# Patient Record
Sex: Female | Born: 1935 | Race: White | Hispanic: No | Marital: Married | Smoking: Never smoker
Health system: Southern US, Community
[De-identification: ages and names within clinical notes are randomized; demographics above are authoritative.]

## PROBLEM LIST (undated history)

## (undated) DIAGNOSIS — K219 Gastro-esophageal reflux disease without esophagitis: Secondary | ICD-10-CM

## (undated) DIAGNOSIS — Z860100 Personal history of colon polyps, unspecified: Secondary | ICD-10-CM

## (undated) DIAGNOSIS — F329 Major depressive disorder, single episode, unspecified: Secondary | ICD-10-CM

## (undated) DIAGNOSIS — F419 Anxiety disorder, unspecified: Secondary | ICD-10-CM

## (undated) DIAGNOSIS — K589 Irritable bowel syndrome without diarrhea: Secondary | ICD-10-CM

## (undated) DIAGNOSIS — N3010 Interstitial cystitis (chronic) without hematuria: Secondary | ICD-10-CM

## (undated) DIAGNOSIS — E785 Hyperlipidemia, unspecified: Secondary | ICD-10-CM

## (undated) DIAGNOSIS — M810 Age-related osteoporosis without current pathological fracture: Secondary | ICD-10-CM

## (undated) DIAGNOSIS — F32A Depression, unspecified: Secondary | ICD-10-CM

## (undated) DIAGNOSIS — G4730 Sleep apnea, unspecified: Secondary | ICD-10-CM

## (undated) HISTORY — DX: Major depressive disorder, single episode, unspecified: F32.9

## (undated) HISTORY — PX: TUBAL LIGATION: SHX77

## (undated) HISTORY — DX: Depression, unspecified: F32.A

## (undated) HISTORY — DX: Hyperlipidemia, unspecified: E78.5

## (undated) HISTORY — DX: Gastro-esophageal reflux disease without esophagitis: K21.9

## (undated) HISTORY — DX: Interstitial cystitis (chronic) without hematuria: N30.10

## (undated) HISTORY — DX: Age-related osteoporosis without current pathological fracture: M81.0

## (undated) HISTORY — DX: Irritable bowel syndrome, unspecified: K58.9

## (undated) HISTORY — DX: Anxiety disorder, unspecified: F41.9

## (undated) HISTORY — PX: ABLATION: SHX5711

## (undated) HISTORY — DX: Personal history of colon polyps, unspecified: Z86.0100

## (undated) HISTORY — DX: Personal history of colonic polyps: Z86.010

## (undated) HISTORY — DX: Sleep apnea, unspecified: G47.30

---

## 2000-06-10 ENCOUNTER — Ambulatory Visit (HOSPITAL_COMMUNITY): Admission: RE | Admit: 2000-06-10 | Discharge: 2000-06-11 | Payer: Self-pay | Admitting: Internal Medicine

## 2000-06-10 ENCOUNTER — Encounter: Payer: Self-pay | Admitting: Internal Medicine

## 2001-12-19 ENCOUNTER — Encounter: Payer: Self-pay | Admitting: Urology

## 2001-12-19 ENCOUNTER — Encounter: Admission: RE | Admit: 2001-12-19 | Discharge: 2001-12-19 | Payer: Self-pay | Admitting: Urology

## 2006-05-31 ENCOUNTER — Ambulatory Visit: Payer: Self-pay | Admitting: Internal Medicine

## 2006-06-11 ENCOUNTER — Inpatient Hospital Stay (HOSPITAL_COMMUNITY): Admission: EM | Admit: 2006-06-11 | Discharge: 2006-06-12 | Payer: Self-pay | Admitting: Emergency Medicine

## 2006-06-11 ENCOUNTER — Ambulatory Visit: Payer: Self-pay | Admitting: Internal Medicine

## 2006-06-21 ENCOUNTER — Ambulatory Visit: Payer: Self-pay

## 2006-06-30 ENCOUNTER — Ambulatory Visit: Payer: Self-pay | Admitting: Internal Medicine

## 2006-07-26 HISTORY — PX: OTHER SURGICAL HISTORY: SHX169

## 2007-08-22 ENCOUNTER — Ambulatory Visit: Payer: Self-pay | Admitting: Ophthalmology

## 2007-09-08 ENCOUNTER — Ambulatory Visit: Payer: Self-pay | Admitting: Ophthalmology

## 2008-09-29 IMAGING — CT CT ANGIO CHEST
2 of 5 series · 19 of 36 positions shown · IV contrast (120 ML OMNI 300)
Comparison: none

CLINICAL DATA: Midsternal chest pain radiating to her back, shortness of breath and heaviness.  Evaluate for acute pulmonary embolus.  
 CT ANGIOGRAPHY OF CHEST:
TECHNIQUE: Multidetector CT imaging of the chest was performed during bolus injection of intravenous contrast.  Multiplanar CT angiographic image reconstructions were generated to evaluate the vascular anatomy.
 Contrast:  100 cc Omnipaque.

[Series 3: pe · axial · 0.70mm/px · z∈[-273,-10]mm · 16 of 240 slices shown]
[im 15/240  lung]
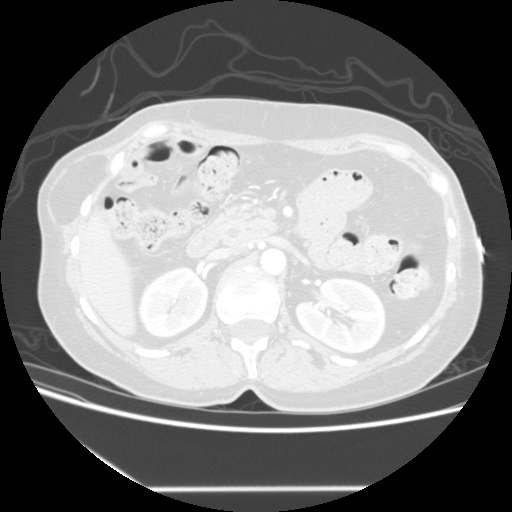
[im 29/240  mediastinal]
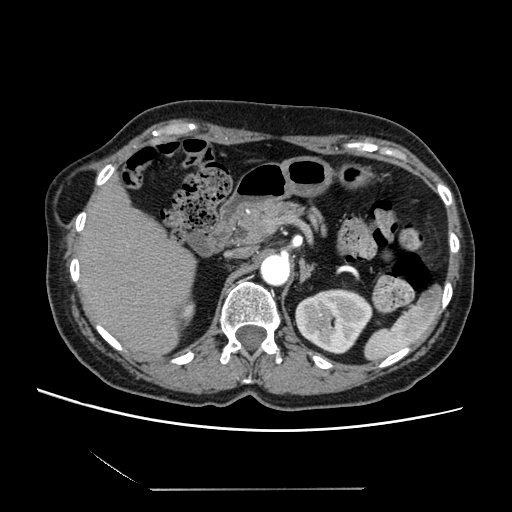
[im 43/240  lung]
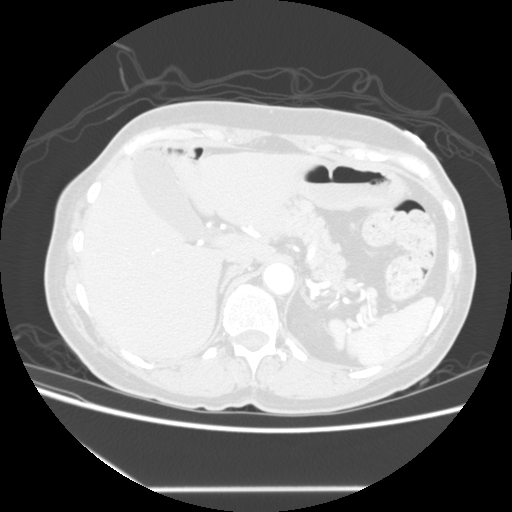
[im 57/240  mediastinal]
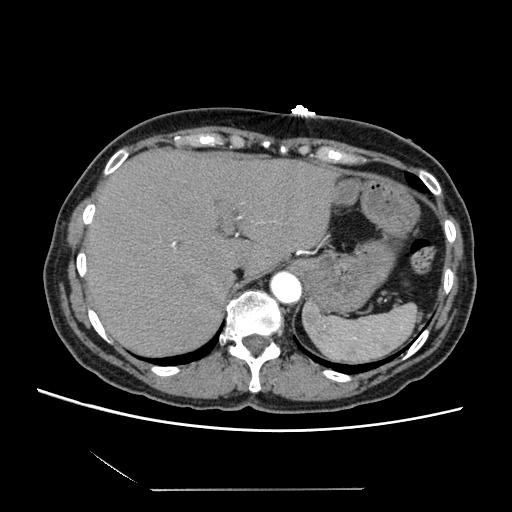
[im 71/240  lung]
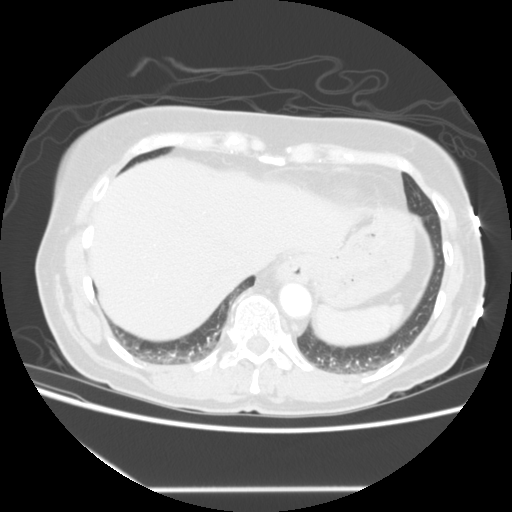
[im 85/240  mediastinal]
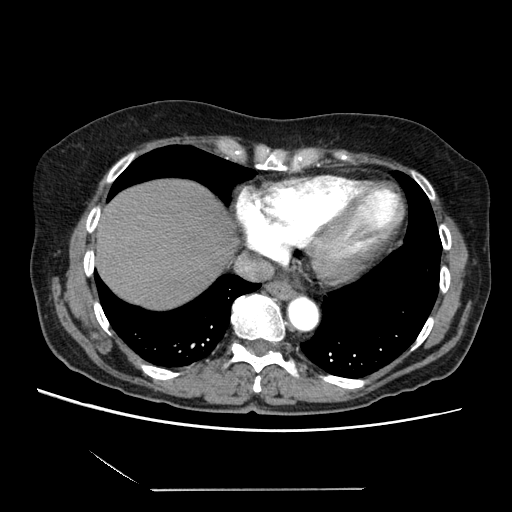
[im 99/240  lung]
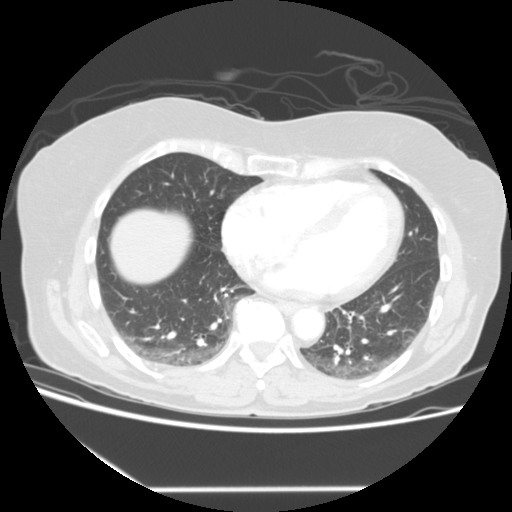
[im 113/240  mediastinal]
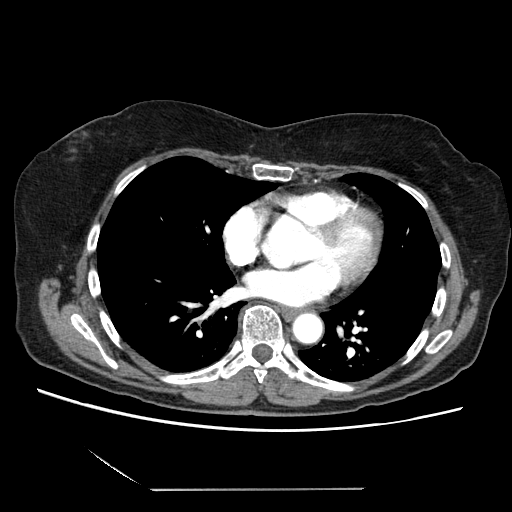
[im 127/240  lung]
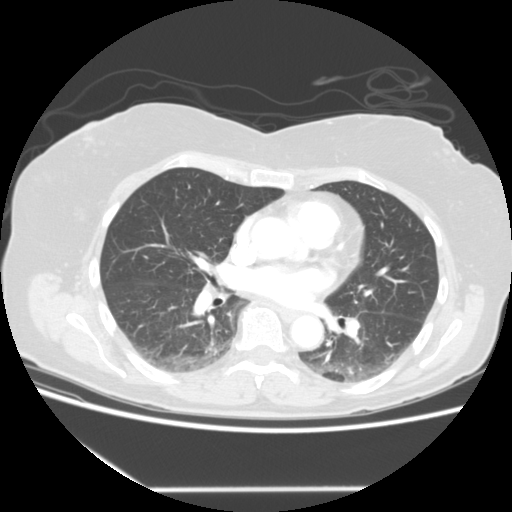
[im 141/240  mediastinal]
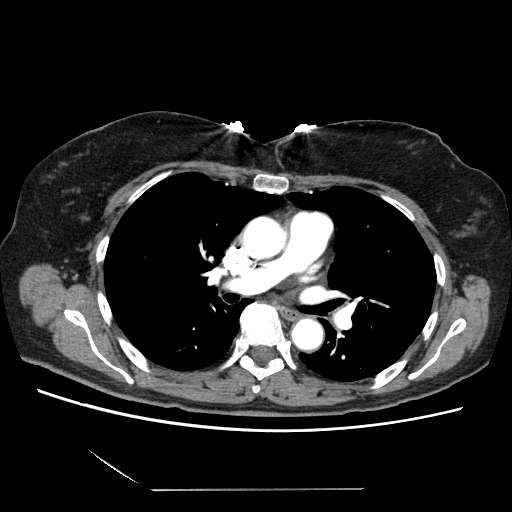
[im 155/240  lung]
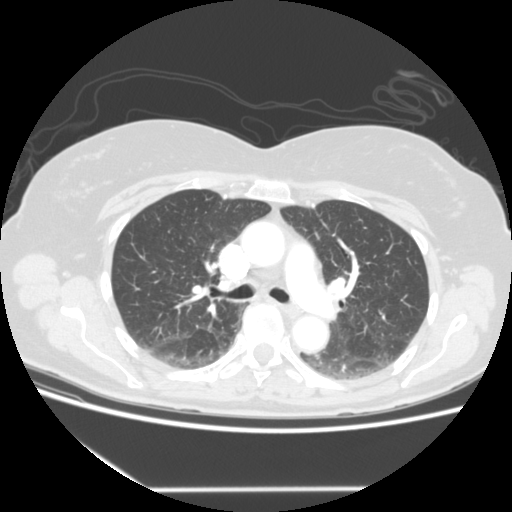
[im 169/240  mediastinal]
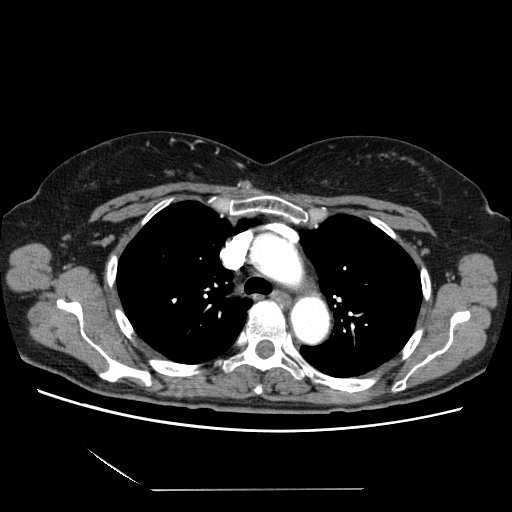
[im 183/240  lung]
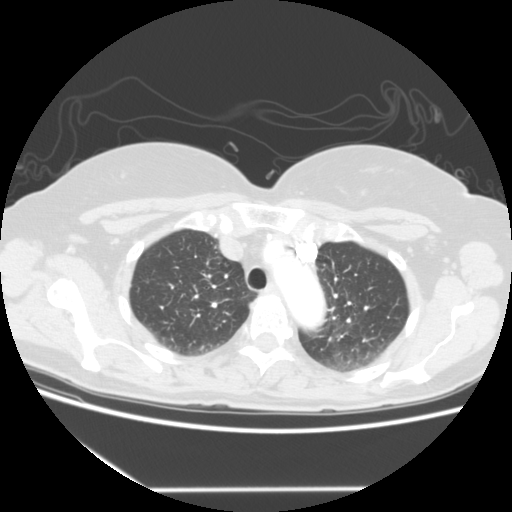
[im 197/240  mediastinal]
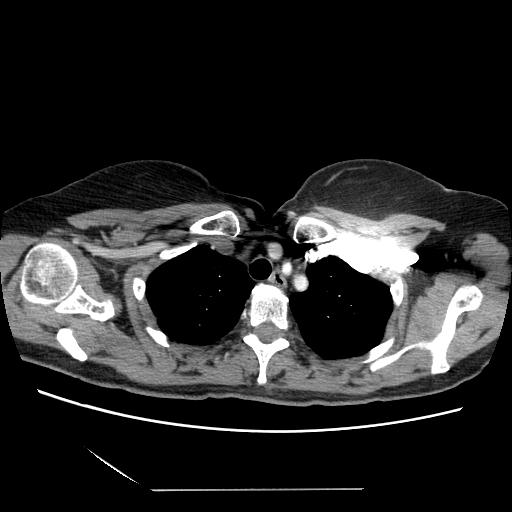
[im 211/240  lung]
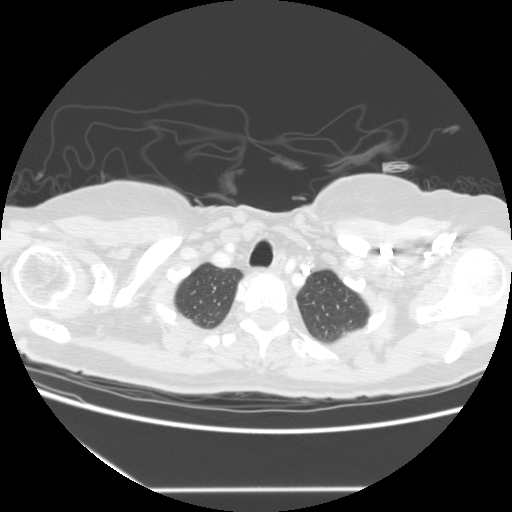
[im 225/240  mediastinal]
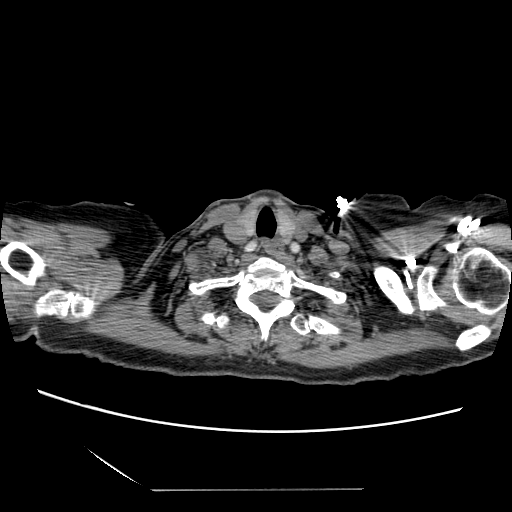

[Series 301: reformatted · coronal · 0.70mm/px · 3 of 102 slices shown]
[im 21/102  mediastinal]
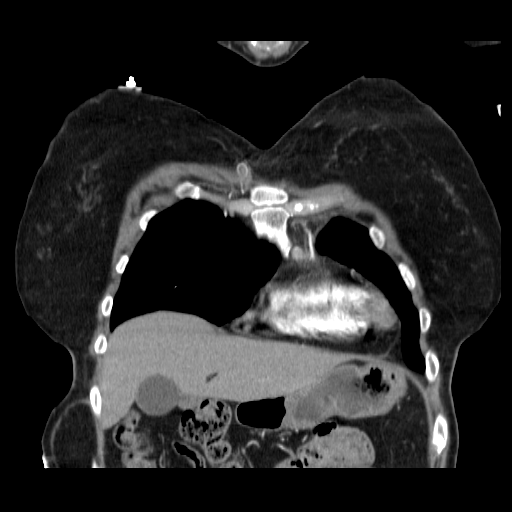
[im 41/102  mediastinal]
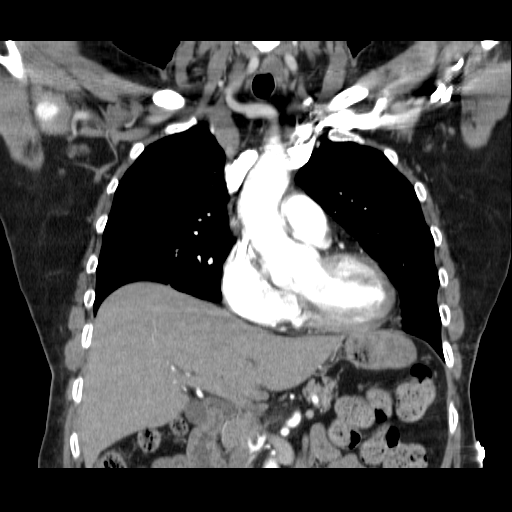
[im 61/102  mediastinal]
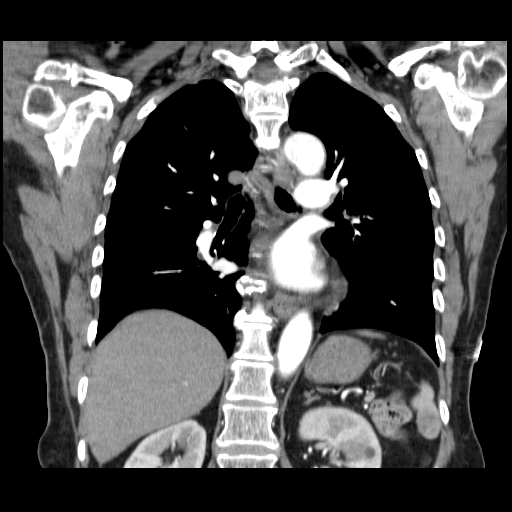

[19 of 36 positions shown; findings below may reference images not displayed]

FINDINGS: There are no abnormal filling defects within the main pulmonary artery or its branches to suggest acute pulmonary embolus.  The ascending and descending thoracic aorta are normal in appearance.
 There is a small hiatal hernia present.  
 No pleural or pericardial effusions are noted. 
 No enlarged axillary lymph nodes.  
 There is no mediastinal or hilar lymphadenopathy.  
 Dependent changes and atelectasis seen at both lung bases posteriorly.  
 No focal pulmonary nodules or masses are noted.  
 There is a fat density mass within the right anterior abdominal wall, which is only partially visualized.  This measures 5.9 X 2.0 cm and likely represents a lipoma.  
 There are two low-density lesions within the left kidney, which likely represent simple cysts.
 Common bile duct is increased in caliber measuring 8.4 mm.  No intrahepatic biliary ductal dilatation is noted.  
 Review of the bone windows shows multilevel thoracic spondylosis.
IMPRESSION: 1.  No acute pulmonary embolus.  
 2.  Dependent changes at both lung bases posteriorly including atelectasis.
 3.  Increased caliber of common bile duct without evidence for intrahepatic biliary ductal dilatation.  
 4.  Fat density mass within right anterior abdominal wall.

## 2012-08-18 ENCOUNTER — Other Ambulatory Visit: Payer: Self-pay | Admitting: *Deleted

## 2012-08-18 DIAGNOSIS — R002 Palpitations: Secondary | ICD-10-CM

## 2016-09-29 ENCOUNTER — Encounter: Payer: Self-pay | Admitting: Internal Medicine

## 2016-12-28 HISTORY — PX: COLONOSCOPY: SHX174

## 2017-09-21 ENCOUNTER — Encounter: Payer: Self-pay | Admitting: Neurology

## 2017-09-21 ENCOUNTER — Ambulatory Visit: Payer: Medicare Other | Admitting: Neurology

## 2017-09-21 VITALS — BP 152/82 | HR 64 | Ht 64.0 in | Wt 134.0 lb

## 2017-09-21 DIAGNOSIS — R413 Other amnesia: Secondary | ICD-10-CM | POA: Diagnosis not present

## 2017-09-21 DIAGNOSIS — R443 Hallucinations, unspecified: Secondary | ICD-10-CM

## 2017-09-21 DIAGNOSIS — F32A Depression, unspecified: Secondary | ICD-10-CM | POA: Insufficient documentation

## 2017-09-21 DIAGNOSIS — F329 Major depressive disorder, single episode, unspecified: Secondary | ICD-10-CM | POA: Diagnosis not present

## 2017-09-21 DIAGNOSIS — F0390 Unspecified dementia without behavioral disturbance: Secondary | ICD-10-CM | POA: Diagnosis not present

## 2017-09-21 NOTE — Progress Notes (Signed)
[facility]  PATIENT: Chelsea Floyd DOB: [birth year]  REFERRING DOCTOR OR PCP:  Joetta Manners, MD SOURCE: Patient, daughter, notes from Dr. Carolyne Fiscal, patient, daughter, notes from Dr. Carolyne Fiscal, laboratory reports  _________________________________   HISTORICAL  CHIEF COMPLAINT:  Chief Complaint  Patient presents with  . New Patient (Initial Visit)    HISTORY OF PRESENT ILLNESS:  I had the pleasure of seeing your patient, Chelsea Floyd, and [facility] for neurologic consultation regarding her memory loss.  She is an 84 year old woman who has had slow;y progressive memory issues and verbal fluency issues over the past few years.    She has had issues with losing items in hr house.   She has some trouble coming up with names.   Her daughter notes that she struggles to come up with words during conversations.     She was started on memantine by her PCP.   She does npt believe she ever tried donepezil.    She has had a few hallucinations, the last one last weekend.   She was sitting up in bed for a while.   She looked over and thought she saw a man with a teenager.   He spoke to her and said "things are happening that you don't know about".   She also did not see her husband in the next bed while this was going on.    The hallucination was not scary or threatening.  I reviewed the results of her recent neurocognitive testing.  It was consistent with a dementia but without distinguishing characteristics.  She is sleeping well at night.  She is not taking naps.    Her husband is also ill and she does the household chores.   She is still driving (just very nearby and daughter drivers her if further)   She has OSA (AHI = 10.3 and hypoxemia 22% of the night).  She cannot wear the mask.  She also felt she could not tolerate the oxygen (they tried [employer] out of [place] and were not happy with customer service).     She tried an oral appliance but her teeth  moved.    She has often had depression and anxiety.    She is currently not more depressed than in the past.       She saw Dr. Killeen Lions in the past for the OSA and hypoxemia.     I personally reviewed the MRI dated 08/10/17.   This shows mild generalized cortical atrophy.  Of note, the mesial temporal lobes are not atrophied out of proportion to the rest of the brain (as would be common with Alzheimer's).  There were extensive T2/FLAIR hyperintense foci in the pons, external capsules and elsewhere in the hemispheres consistent with chronic microvascular ischemic change.  There are no large vessel strokes.   Montreal Cognitive Assessment  09/21/2017 09/21/2017  Visuospatial/ Executive (0/5) 2 2  Naming (0/3) 2 2  Attention: Read list of digits (0/2) 0 0  Attention: Read list of letters (0/1) 0 0  Attention: Serial 7 subtraction starting at 100 (0/3) 0 0  Language: Repeat phrase (0/2) 0 0  Language : Fluency (0/1) 0 0  Abstraction (0/2) 2 2  Delayed Recall (0/5) 2 2  Orientation (0/6) 6 6  Total 14 14  Adjusted Score (based on education) 14 15    Neurocognitive evaluation (Dr. Jacquelyne Balint 06/23/2017) SUMMARY & IMPRESSION: Mrs. Chelsea Floyd is an 84 year old, right-handed, Caucasian female, who reported experiencing  progressively worsening cognitive difficulties over the past 2 years. Onset was insidious with no precipitating factors endorsed.  Test results revealed reduced (or impaired) performances on tests measuring multiple cognitive domains and thinking skills. Predominant deficits were noted on tasks measuring executive cognitive processes. Learning and memory, however, was generally within normal limits. From an emotional standpoint, there appears to be a moderate degree of recent depression and severe anxiety.  Mrs. Zinn's performance is consistent with a diagnosis of Major Neurocognitive Disorder. At present an exact etiology in unclear, though this is not the typical pattern seen in those with  Alzheimer's disease, but a neurodegenerative process cannot be fully ruled out. I do not think that medication side effects are fully responsible for these issues. Untreated sleep apnea may be playing a role but is likely less salient than another etiology. Significant mood disruption is also likely playing a role to some degree but is again not likely to be the sole driving force behind these difficulties. Neuroimaging would be quite valuable in this case, particularly to assess for cerebrovascular disease as this is a common cause of these types of problems.     MRI of the brain 08/10/2017 official report: IMPRESSION: 1.No acute intracranial abnormality. 2. Cerebral volume appears normal for age. 3. Mild for age cerebral white matter and moderate for age deep gray matter and pontine nonspecific T2/FLAIR hyperintensity, most commonly due to chronic small vessel disease.      REVIEW OF SYSTEMS: Constitutional: No fevers, chills, sweats, or change in appetite Eyes: No visual changes, double vision, eye pain Ear, nose and throat: No hearing loss, ear pain, nasal congestion, sore throat Cardiovascular: No chest pain, palpitations Respiratory: No shortness of breath at rest or with exertion.  Has OSA and hypoxemia at night GastrointestinaI: No nausea, vomiting, diarrhea, abdominal pain, fecal incontinence Genitourinary: No dysuria, urinary retention or frequency.  No nocturia. Musculoskeletal: No neck pain, back pain Integumentary: No rash, pruritus, skin lesions Neurological: as above Psychiatric: as above  Endocrine: No palpitations, diaphoresis, change in appetite, change in weigh or increased thirst Hematologic/Lymphatic: No anemia, purpura, petechiae. Allergic/Immunologic: No itchy/runny eyes, nasal congestion, recent allergic reactions, rashes  ALLERGIES: Allergies  Allergen Reactions  . Ciprofloxacin Nausea Only    HOME MEDICATIONS:  Current Outpatient Medications:  .   lovastatin (MEVACOR) 40 MG tablet, Take 1 tablet by mouth daily., Disp: , Rfl:  .  memantine (NAMENDA) 5 MG tablet, Take 1 tablet by mouth 2 (two) times daily., Disp: , Rfl:  .  metoprolol succinate (TOPROL-XL) 25 MG 24 hr tablet, Take 1 tablet by mouth daily., Disp: , Rfl:  .  olmesartan (BENICAR) 20 MG tablet, Take 0.5 tablets by mouth daily., Disp: , Rfl:  .  Cholecalciferol (VITAMIN D3) 1000 units CAPS, Take 1 capsule by mouth daily., Disp: , Rfl:   PAST MEDICAL HISTORY: History reviewed. No pertinent past medical history.  PAST SURGICAL HISTORY: History reviewed. No pertinent surgical history.  FAMILY HISTORY: History reviewed. No pertinent family history.  SOCIAL HISTORY:  Social History   Socioeconomic History  . Marital status: Married    Spouse name: Not on file  . Number of children: Not on file  . Years of education: Not on file  . Highest education level: Not on file  Occupational History  . Not on file  Social Needs  . Financial resource strain: Not on file  . Food insecurity:    Worry: Not on file    Inability: Not on file  .  Transportation needs:    Medical: Not on file    Non-medical: Not on file  Tobacco Use  . Smoking status: Never Smoker  . Smokeless tobacco: Never Used  Substance and Sexual Activity  . Alcohol use: Not on file  . Drug use: Not on file  . Sexual activity: Not on file  Lifestyle  . Physical activity:    Days per week: Not on file    Minutes per session: Not on file  . Stress: Not on file  Relationships  . Social connections:    Talks on phone: Not on file    Gets together: Not on file    Attends religious service: Not on file    Active member of club or organization: Not on file    Attends meetings of clubs or organizations: Not on file    Relationship status: Not on file  . Intimate partner violence:    Fear of current or ex partner: Not on file    Emotionally abused: Not on file    Physically abused: Not on file     Forced sexual activity: Not on file  Other Topics Concern  . Not on file  Social History Narrative  . Not on file     PHYSICAL EXAM  Vitals:   09/21/17 0913  BP: (!) 152/82  Pulse: 64  Weight: 134 lb (60.8 kg)  Height: 5\' 4"  (1.626 m)    Body mass index is 23 kg/m.   General: The patient is well-developed and well-nourished and in no acute distress   Neck: The neck is supple, no carotid bruits are noted.  The neck is nontender.  Cardiovascular: The heart has a regular rate and rhythm with a normal S1 and S2. There were no murmurs, gallops or rubs. Lungs are clear to auscultation.  Skin: Extremities are without significant edema.  Musculoskeletal:  Back is nontender  Neurologic Exam  Mental status: The patient is alert and oriented x 3 at the time of the examination.  Short-term memory and focus/attention reduced.  She [facility] the [facility] though did not appear to be that impaired.  Speech is normal.  Cranial nerves: Extraocular movements are full. Pupils are equal, round, and reactive to light and accomodation.  Visual fields are full.  Facial symmetry is present. There is good facial sensation to soft touch bilaterally.Facial strength is normal.  Trapezius and sternocleidomastoid strength is normal. No dysarthria is noted.  The tongue is midline, and the patient has symmetric elevation of the soft palate. No obvious hearing deficits are noted.  Motor:  Muscle bulk is normal.   Tone is normal. Strength is  5 / 5 in all 4 extremities.   Sensory: Sensory testing is intact to pinprick, soft touch and vibration sensation in all 4 extremities.  Coordination: Cerebellar testing reveals good finger-nose-finger and heel-to-shin bilaterally.  Gait and station: Station is normal.   Gait is normal. Tandem gait is mildly wide but probably normal for age. Romberg is negative.   Reflexes: Deep tendon reflexes are symmetric and normal bilaterally.   Plantar  responses are flexor.    DIAGNOSTIC DATA (LABS, IMAGING, TESTING) - I reviewed patient records, labs, notes, testing and imaging myself where available.      ASSESSMENT AND PLAN  Memory loss - Plan: Vitamin B12, VITAMIN D 25 Hydroxy (Vit-D Deficiency, Fractures), Sedimentation rate, TSH, Homocysteine  Dementia without behavioral disturbance, unspecified dementia type - Plan: Vitamin B12, VITAMIN D 25 Hydroxy (Vit-D Deficiency,  Fractures), Sedimentation rate, TSH, Homocysteine  Hallucinations - Plan: Vitamin B12, VITAMIN D 25 Hydroxy (Vit-D Deficiency, Fractures), Sedimentation rate, TSH, Homocysteine  Depression, unspecified depression type - Plan: Vitamin B12, VITAMIN D 25 Hydroxy (Vit-D Deficiency, Fractures), Sedimentation rate, TSH, Homocysteine   In summary, Mrs. Brunsman is an 84 year old woman with dementia (moderate by [facility] cognitive assessment score but she appears mild).  Her performance and MRI would be more consistent with a vascular dementia rather than Alzheimer's disease.  I would check some additional blood work to rule out treatable causes of dementia and conditions that could worsen vascular dementia.  She will take an enteric-coated baby aspirin daily.   We discussed adding back on nocturnal oxygen.  It is possible that her cognition would improve with better oxygenation at night.  She cannot tolerate CPAP and did not tolerate an oral appliance.   I will see we can get this ordered based on her polysomnogram.  If not, we will set up a home overnight oximetry.  She will return to see me in 4 months or sooner if there are new or worsening neurologic symptoms.  Thank you for asking me to see Mrs. Guedes.  Please let me know if I can be of further assistance with her or other patients in the future.   Richard A. Epimenio Foot, MD, PhD, [place] 09/21/2017, 9:43 AM Certified in Neurology, Clinical Neurophysiology, Sleep Medicine, Pain Medicine and Neuroimaging  [facility] [address], [address] [place], [place] [address] [phone]

## 2017-09-22 ENCOUNTER — Encounter: Payer: Self-pay | Admitting: *Deleted

## 2017-09-22 ENCOUNTER — Telehealth: Payer: Self-pay | Admitting: *Deleted

## 2017-09-22 LAB — VITAMIN D 25 HYDROXY (VIT D DEFICIENCY, FRACTURES): VIT D 25 HYDROXY: 32.2 ng/mL (ref 30.0–100.0)

## 2017-09-22 LAB — HOMOCYSTEINE: HOMOCYSTEINE: 7.9 umol/L (ref 0.0–15.0)

## 2017-09-22 LAB — TSH: TSH: 2.37 u[IU]/mL (ref 0.450–4.500)

## 2017-09-22 LAB — VITAMIN B12: VITAMIN B 12: 428 pg/mL (ref 232–1245)

## 2017-09-22 LAB — SEDIMENTATION RATE: Sed Rate: 4 mm/hr (ref 0–40)

## 2017-09-22 NOTE — Telephone Encounter (Signed)
[facility]AS would like to order home O2 for pt. I need the sleep study that she had in 2016 at Dr. Lasandra BeechMieden's office (which is no longer open).  In order to get the oxygen ordered, I need the sleep study she had in 2016, ordered by Dr. Johnell ComingsMieden.  (most [facility]).  I have tried getting the study sent by the hospital, but pt. must sign a release for this.  She can come into our office and sign it, or sign a form at [facility], requesting all sleep studies she has had, be faxed to Dr. Epimenio FootSater at fax# [phone].  If she can't get the study, Dr. Epimenio FootSater will order a home sleep study.  I tried calling pt. at phone# [phone] and received message that this # is not in service.  I tried calling her dtr., Vonna KotykKaren Cockman (the only person listed on her DPR), at phone# [phone], and received message that vm is not set up. Also tried calling pt. at phone# [phone], but received message that vm is not set up.  Unable to contact letter mailed to pt's home address/fim

## 2017-09-22 NOTE — Telephone Encounter (Signed)
Noted.  Med. rec. release up front [facility] for pt. to sign/fim

## 2017-09-22 NOTE — Telephone Encounter (Signed)
Pts daughter called aware they will need to come in to sign a medical release form, stating they will come in as soon as they are able.

## 2017-09-23 ENCOUNTER — Telehealth: Payer: Self-pay | Admitting: Neurology

## 2017-09-23 NOTE — Telephone Encounter (Signed)
-----   Message from Asa Lenteichard A Sater, MD sent at 09/22/2017  6:08 PM EDT ----- Please let the patient know that the lab work is fine.

## 2017-09-23 NOTE — Telephone Encounter (Signed)
Called the pt's daughter and made her aware of the lab work being normal. She verbalized understanding and had no concerns.

## 2017-10-05 NOTE — Telephone Encounter (Signed)
Noted/fim 

## 2017-10-05 NOTE — Telephone Encounter (Signed)
Pt called stating that she will be seeing her PCP in the middle of may and would like to discuss her visit with Dr. Epimenio FootSater with the PCP before she makes any decisions. Stating she will get her sleep study sent over [facility]

## 2018-01-20 ENCOUNTER — Ambulatory Visit: Payer: Medicare Other | Admitting: Neurology

## 2018-04-21 ENCOUNTER — Ambulatory Visit: Payer: Self-pay | Admitting: Gastroenterology

## 2018-04-25 ENCOUNTER — Encounter: Payer: Self-pay | Admitting: Gastroenterology

## 2018-05-03 ENCOUNTER — Ambulatory Visit: Payer: Self-pay | Admitting: Gastroenterology

## 2018-05-09 ENCOUNTER — Telehealth: Payer: Self-pay | Admitting: Gastroenterology

## 2018-05-09 NOTE — Telephone Encounter (Signed)
Patient wanting to know if Dr.Gupta wants her to have lab work or a stool test done before she comes in for appt on 12.6.19. Pt [place].

## 2018-05-09 NOTE — Telephone Encounter (Signed)
Called and spoke with patient's daughter-informed patient of MD recommendations and information regarding lab work being done after the MD sees the patient; patient's daughter verbalized understanding and will inform the patient; patient's daughter was also informed to call back if questions/concerns arise;

## 2018-05-09 NOTE — Telephone Encounter (Signed)
Not as yet Let me see her first Thanks

## 2018-05-20 ENCOUNTER — Ambulatory Visit: Payer: Self-pay | Admitting: Gastroenterology

## 2018-05-26 ENCOUNTER — Telehealth: Payer: Self-pay | Admitting: Gastroenterology

## 2018-05-27 NOTE — Telephone Encounter (Signed)
Left message for patient to call back  

## 2018-05-27 NOTE — Telephone Encounter (Signed)
Left message on VM for patient to call back; After speaking with patient's daughter-daughter advised office that patient is not taking prescribed medications (fiber/water/diet related to IBS); daughter advised patient will not listen to her about instructions concerning IBS-patient's daughter states "she might listen and do what Dr. Chales AbrahamsGupta wants her to do if he calls her or if the nurse calls her"; informed patient's daughter the office would attempt to reach patient and instruct her again;

## 2018-05-31 NOTE — Telephone Encounter (Signed)
Unable to reach patient and also unable to leave a message as "mailbox if currently full"; Will send a letter to patient concerning her issue with "not having a decent BM";

## 2018-06-09 ENCOUNTER — Encounter

## 2018-06-09 ENCOUNTER — Ambulatory Visit: Payer: Medicare Other | Admitting: Neurology

## 2019-06-13 ENCOUNTER — Institutional Professional Consult (permissible substitution): Payer: Medicare Other | Admitting: Neurology

## 2019-08-22 ENCOUNTER — Encounter: Payer: Self-pay | Admitting: Neurology

## 2019-08-22 ENCOUNTER — Ambulatory Visit: Payer: Medicare Other | Admitting: Neurology

## 2019-08-22 ENCOUNTER — Other Ambulatory Visit: Payer: Self-pay

## 2019-08-22 VITALS — BP 135/78 | HR 90 | Temp 97.2°F | Ht 64.0 in | Wt 135.5 lb

## 2019-08-22 DIAGNOSIS — G4733 Obstructive sleep apnea (adult) (pediatric): Secondary | ICD-10-CM

## 2019-08-22 DIAGNOSIS — R413 Other amnesia: Secondary | ICD-10-CM | POA: Diagnosis not present

## 2019-08-22 DIAGNOSIS — F0390 Unspecified dementia without behavioral disturbance: Secondary | ICD-10-CM | POA: Diagnosis not present

## 2019-08-22 DIAGNOSIS — F32A Depression, unspecified: Secondary | ICD-10-CM

## 2019-08-22 DIAGNOSIS — F329 Major depressive disorder, single episode, unspecified: Secondary | ICD-10-CM | POA: Diagnosis not present

## 2019-08-22 MED ORDER — BUSPIRONE HCL 7.5 MG PO TABS: 7.5000 mg | ORAL_TABLET | Freq: Two times a day (BID) | ORAL | 5 refills | Status: AC

## 2019-08-22 NOTE — Progress Notes (Signed)
[facility]  PATIENT: Chelsea Floyd DOB: [birth year]  REFERRING DOCTOR OR PCP:  Ward Givens, MD SOURCE: Patient, daughter, notes from Dr. Harrell Lark, patient, daughter, notes from Dr. Harrell Lark, laboratory reports  _________________________________   HISTORICAL  CHIEF COMPLAINT:  Chief Complaint  Patient presents with  . New Patient (Initial Visit)    Rm 12 with daughter (temp: 97.5). Paper referral from Dr. Harrell Lark (PCP) for dementia. Memory loss has been gradual. Been getting worse since covid-19 started. Did MOCA: 18/30. She lives with husband. Daughter stops by periodically but not for extended periods of time d/t covid.    HISTORY OF PRESENT ILLNESS:  Chelsea Floyd is an 86 year old woman with progressive memory loss.  Update 08/22/2019: Her daughter notes that her cognitive skills have worsened over the last year, since COVID-19 started.  She feels her memory has worsened over the last couple years.    She sometimes has difficulty with forming sentences though not always.    She has trouble following recipes.    She drives but just a few blocks to the grocery store.   She does do some financial simple tasks.     Her daughter notes Chelsea Floyd is more depressed and sometimes irritable.    Her husband is not much of a conversationalist so she has not had much social stimulation.   She is more stressed.   Her brother and most cousins have died.    She has anxiety and depression.   She is more fidgety.   She is on duloxetine.     She has OSA but could not tolerate CPAP despite trying may different masks.   She has tried oral appliances too.     I saw her about 2 years ago.  At that time, I reviewed an MRI performed February 2019.  It showed mild generalized cortical atrophy.  Of note, the mesial temporal lobes were not atrophied out of proportion to the rest of the brain (as would be common with Alzheimer's).  There were extensive T2/FLAIR hyperintense foci in the pons, external capsules  and elsewhere in the hemispheres consistent with chronic microvascular ischemic change.  There were no large vessel strokes.  Blood work 09/21/2017 showed normal homocysteine, TSH, ESR, vitamin D and vitamin B12.     Montreal Cognitive Assessment  08/22/2019 09/21/2017 09/21/2017  Visuospatial/ Executive (0/5) 2 2 2   Naming (0/3) 2 2 2   Attention: Read list of digits (0/2) 2 0 0  Attention: Read list of letters (0/1) 1 0 0  Attention: Serial 7 subtraction starting at 100 (0/3) 0 0 0  Language: Repeat phrase (0/2) 1 0 0  Language : Fluency (0/1) 1 0 0  Abstraction (0/2) 2 2 2   Delayed Recall (0/5) 2 2 2   Orientation (0/6) 5 6 6   Total 18 14 14   Adjusted Score (based on education) 18 14 15     From initial consultation 09/21/2017: She is an 86 year old woman who has had slow;y progressive memory issues and verbal fluency issues over the past few years.    She has had issues with losing items in hr house.   She has some trouble coming up with names.   Her daughter notes that she struggles to come up with words during conversations.     She was started on memantine by her PCP.   She does npt believe she ever tried donepezil.    She has had a few hallucinations, the last one last weekend.   She was  sitting up in bed for a while.   She looked over and thought she saw a man with a teenager.   He spoke to her and said "things are happening that you don't know about".   She also did not see her husband in the next bed while this was going on.    The hallucination was not scary or threatening.  I reviewed the results of her recent neurocognitive testing.  It was consistent with a dementia but without distinguishing characteristics.  She is sleeping well at night.  She is not taking naps.    Her husband is also ill and she does the household chores.   She is still driving (just very nearby and daughter drivers her if further)   She has OSA (AHI = 10.3 and hypoxemia 22% of the night).  She cannot wear the  mask.  She also felt she could not tolerate the oxygen (they tried [employer] out of [place] and were not happy with customer service).     She tried an oral appliance but her teeth moved.    She has often had depression and anxiety.    She is currently not more depressed than in the past.       She saw Dr. Rollene Rotunda in the past for the OSA and hypoxemia.     I personally reviewed the MRI dated 08/10/17.   This shows mild generalized cortical atrophy.  Of note, the mesial temporal lobes are not atrophied out of proportion to the rest of the brain (as would be common with Alzheimer's).  There were extensive T2/FLAIR hyperintense foci in the pons, external capsules and elsewhere in the hemispheres consistent with chronic microvascular ischemic change.  There are no large vessel strokes.    Neurocognitive evaluation (Dr. Vikki Ports 06/23/2017) SUMMARY & IMPRESSION: Chelsea Floyd is an 86 year old, right-handed, Caucasian female, who reported experiencing progressively worsening cognitive difficulties over the past 2 years. Onset was insidious with no precipitating factors endorsed.  Test results revealed reduced (or impaired) performances on tests measuring multiple cognitive domains and thinking skills. Predominant deficits were noted on tasks measuring executive cognitive processes. Learning and memory, however, was generally within normal limits. From an emotional standpoint, there appears to be a moderate degree of recent depression and severe anxiety.  Chelsea Floyd's performance is consistent with a diagnosis of Major Neurocognitive Disorder. At present an exact etiology in unclear, though this is not the typical pattern seen in those with Alzheimer's disease, but a neurodegenerative process cannot be fully ruled out. I do not think that medication side effects are fully responsible for these issues. Untreated sleep apnea may be playing a role but is likely less salient than another etiology.  Significant mood disruption is also likely playing a role to some degree but is again not likely to be the sole driving force behind these difficulties. Neuroimaging would be quite valuable in this case, particularly to assess for cerebrovascular disease as this is a common cause of these types of problems.     MRI of the brain 08/10/2017 official report: IMPRESSION: 1.No acute intracranial abnormality. 2. Cerebral volume appears normal for age. 3. Mild for age cerebral white matter and moderate for age deep gray matter and pontine nonspecific T2/FLAIR hyperintensity, most commonly due to chronic small vessel disease.      REVIEW OF SYSTEMS: Constitutional: No fevers, chills, sweats, or change in appetite Eyes: No visual changes, double vision, eye pain Ear, nose and throat: No hearing  loss, ear pain, nasal congestion, sore throat Cardiovascular: No chest pain, palpitations Respiratory: No shortness of breath at rest or with exertion.  Has OSA and hypoxemia at night GastrointestinaI: No nausea, vomiting, diarrhea, abdominal pain, fecal incontinence Genitourinary: No dysuria, urinary retention or frequency.  No nocturia. Musculoskeletal: No neck pain, back pain Integumentary: No rash, pruritus, skin lesions Neurological: as above Psychiatric: as above  Endocrine: No palpitations, diaphoresis, change in appetite, change in weigh or increased thirst Hematologic/Lymphatic: No anemia, purpura, petechiae. Allergic/Immunologic: No itchy/runny eyes, nasal congestion, recent allergic reactions, rashes  ALLERGIES: Allergies  Allergen Reactions  . Ciprofloxacin Nausea Only    HOME MEDICATIONS:  Current Outpatient Medications:  .  acetaminophen (TYLENOL) 500 MG tablet, Take 500 mg by mouth every 6 (six) hours as needed., Disp: , Rfl:  .  Biotin 10000 MCG TABS, Take 1 Dose by mouth daily., Disp: , Rfl:  .  DULoxetine (CYMBALTA) 60 MG capsule, Take 60 mg by mouth daily., Disp: ,  Rfl:  .  Estradiol 10 MCG TABS vaginal tablet, Place 1 tablet vaginally 3 (three) times a week., Disp: , Rfl:  .  lovastatin (MEVACOR) 40 MG tablet, Take 1 tablet by mouth daily., Disp: , Rfl:  .  memantine (NAMENDA) 5 MG tablet, Take 1 tablet by mouth 2 (two) times daily., Disp: , Rfl:  .  metoprolol succinate (TOPROL-XL) 25 MG 24 hr tablet, Take 1 tablet by mouth daily., Disp: , Rfl:  .  olmesartan (BENICAR) 20 MG tablet, Take 0.5 tablets by mouth daily., Disp: , Rfl:  .  omeprazole (PRILOSEC) 40 MG capsule, Take 40 mg by mouth daily., Disp: , Rfl:  .  VITAMIN D PO, Take 1,000 Units by mouth daily., Disp: , Rfl:  .  busPIRone (BUSPAR) 7.5 MG tablet, Take 1 tablet (7.5 mg total) by mouth 2 (two) times daily., Disp: 60 tablet, Rfl: 5  PAST MEDICAL HISTORY: Past Medical History:  Diagnosis Date  . Anxiety   . Depression   . GERD (gastroesophageal reflux disease)   . Hx of colonic polyps   . Hyperlipidemia   . IBS (irritable bowel syndrome)   . Interstitial cystitis   . Osteoporosis   . Sleep apnea     PAST SURGICAL HISTORY: Past Surgical History:  Procedure Laterality Date  . ABLATION    . COLONOSCOPY  12/28/2016   Colonic polyp status post polypectomy. Internal hemorrhoids.   . EGD  07/26/2006   Hiatal hernia. Healed esophageal erosions. Incidental gastric polyp. Mild gastitis.   . TUBAL LIGATION      FAMILY HISTORY: History reviewed. No pertinent family history.  SOCIAL HISTORY:  Social History   Socioeconomic History  . Marital status: Married    Spouse name: Not on file  . Number of children: Not on file  . Years of education: Not on file  . Highest education level: Not on file  Occupational History  . Not on file  Tobacco Use  . Smoking status: Never Smoker  . Smokeless tobacco: Never Used  Substance and Sexual Activity  . Alcohol use: Not Currently  . Drug use: Not Currently  . Sexual activity: Not on file  Other Topics Concern  . Not on file  Social  History Narrative   Right handed    Lives with husband   Caffeine MEQ:ASTMHD   Social Determinants of Health   Financial Resource Strain:   . Difficulty of Paying Living Expenses: Not on file  Food Insecurity:   . Worried About  Running Out of Food in the Last Year: Not on file  . Ran Out of Food in the Last Year: Not on file  Transportation Needs:   . Lack of Transportation (Medical): Not on file  . Lack of Transportation (Non-Medical): Not on file  Physical Activity:   . Days of Exercise per Week: Not on file  . Minutes of Exercise per Session: Not on file  Stress:   . Feeling of Stress : Not on file  Social Connections:   . Frequency of Communication with Friends and Family: Not on file  . Frequency of Social Gatherings with Friends and Family: Not on file  . Attends Religious Services: Not on file  . Active Member of Clubs or Organizations: Not on file  . Attends Archivist Meetings: Not on file  . Marital Status: Not on file  Intimate Partner Violence:   . Fear of Current or Ex-Partner: Not on file  . Emotionally Abused: Not on file  . Physically Abused: Not on file  . Sexually Abused: Not on file     PHYSICAL EXAM  Vitals:   08/22/19 1044  BP: 135/78  Pulse: 90  Temp: (!) 97.2 F (36.2 C)  Weight: 135 lb 8 oz (61.5 kg)  Height: 5' 4"  (1.626 m)    Body mass index is 23.26 kg/m.   General: The patient is well-developed and well-nourished and in no acute distress   Neck: The neck is supple, no carotid bruits are noted.  The neck is nontender.  Cardiovascular: The heart has a regular rate and rhythm with a normal S1 and S2. There were no murmurs, gallops or rubs. Lungs are clear to auscultation.  Skin: Extremities are without significant edema.  Musculoskeletal:  Back is nontender  Neurologic Exam  Mental status: The patient is alert and oriented x 3 at the time of the examination.  Short-term memory and focus/attention reduced.  She scored  18/30 on the [facility] cognitive assessment though did not appear to be that impaired.  For the most part speech was normal.  She did have 2 situations where she had trouble constructing a sentence.  Cranial nerves: Extraocular movements are full.  Facial strength is normal.  Trapezius and sternocleidomastoid strength is normal. No dysarthria is noted.  The tongue is midline, and the patient has symmetric elevation of the soft palate. No obvious hearing deficits are noted.  Motor:  Muscle bulk is normal.   Tone is normal. Strength is  5 / 5 in all 4 extremities.   Sensory: Sensory testing is intact to pinprick, soft touch and vibration sensation in all 4 extremities.  Coordination: Cerebellar testing reveals good finger-nose-finger and heel-to-shin bilaterally.  Gait and station: Station is normal.   Gait is normal.  The tandem gait is mildly wide but normal for age.  However, she does have retropulsion when the posture is disturbed.. Romberg is negative.   Reflexes: Deep tendon reflexes are symmetric and normal bilaterally.      ASSESSMENT AND PLAN  Dementia without behavioral disturbance, unspecified dementia type (HCC)  Depression, unspecified depression type  Memory loss  OSA (obstructive sleep apnea)   1.    She appears to have vascular dementia due to extensive chronic microvascular ischemic changes.  Although both her and her daughter feels that she is doing worse, she actually scored slightly better on the [facility] cognitive assessment today than she did 2 years ago.  The perception of doing worse is likely due to mood  issues superimposed on her vascular dementia.  Very long discussion about her dementia.  It tends to progress more slowly than Alzheimer's disease.  However, she may worsen over time.  Besides affecting her cognition, reaction time and balance may be affected by can increase the possibility of a fall she is advised to remain careful 2.   She is on duloxetine for her  mood disorders but anxiety has worsened over the last year.  I will add BuSpar 7.5 mg twice daily.  I would be reluctant to add a benzodiazepine due to her memory loss. 3.   We discussed for trying to get out of the house more, especially now that she has had her vaccination.  [facility] soon 4.   As far as long term situation, she is able to live independently at this point as long as there is some assistance for more complex tasks. 5.   She will return to see [place] as needed.   Daire Okimoto A. Felecia Shelling, MD, PhD, Charlynn Grimes 11/14/2430, 75:56 PM Certified in Neurology, Clinical Neurophysiology, Sleep Medicine, Pain Medicine and Neuroimaging  [facility] [address] [place], [place] [address] [phone]

## 2024-02-14 DEATH — deceased
